# Patient Record
Sex: Male | Born: 1985 | Race: Black or African American | Hispanic: No | Marital: Single | Smoking: Current every day smoker
Health system: Southern US, Community
[De-identification: ages and names within clinical notes are randomized; demographics above are authoritative.]

## PROBLEM LIST (undated history)

## (undated) HISTORY — PX: WRIST ARTHROCENTESIS: SUR48

---

## 2006-09-05 ENCOUNTER — Observation Stay (HOSPITAL_COMMUNITY): Admission: EM | Admit: 2006-09-05 | Discharge: 2006-09-06 | Payer: Self-pay | Admitting: Emergency Medicine
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
## 2010-12-13 LAB — CROSSMATCH: ABO/RH(D): O POS

## 2010-12-13 LAB — CBC
MCV: 92.8
Platelets: 175
RBC: 2.49 — ABNORMAL LOW
WBC: 13.1 — ABNORMAL HIGH

## 2010-12-13 LAB — COMPREHENSIVE METABOLIC PANEL
ALT: <8
AST: 18
Albumin: 2.4 — ABNORMAL LOW
CO2: 28
Chloride: 105
Creatinine, Ser: 0.83
GFR calc Af Amer: >60
GFR calc non Af Amer: >60
Potassium: 3.8
Sodium: 137
Total Bilirubin: 0.5

## 2010-12-13 LAB — POCT I-STAT 4, (NA,K, GLUC, HGB,HCT)
Glucose, Bld: 146 — ABNORMAL HIGH
HCT: 30 — ABNORMAL LOW
Hemoglobin: 10.2 — ABNORMAL LOW
Hemoglobin: 8.5 — ABNORMAL LOW
Operator id: 238531
Potassium: 3.7
Potassium: 4.9
Sodium: 137

## 2010-12-13 LAB — ABO/RH: ABO/RH(D): O POS

## 2015-07-23 ENCOUNTER — Encounter (HOSPITAL_COMMUNITY): Payer: Self-pay | Admitting: *Deleted

## 2015-07-23 DIAGNOSIS — Y998 Other external cause status: Secondary | ICD-10-CM | POA: Insufficient documentation

## 2015-07-23 DIAGNOSIS — Y9389 Activity, other specified: Secondary | ICD-10-CM | POA: Insufficient documentation

## 2015-07-23 DIAGNOSIS — Y92410 Unspecified street and highway as the place of occurrence of the external cause: Secondary | ICD-10-CM | POA: Insufficient documentation

## 2015-07-23 DIAGNOSIS — S29012A Strain of muscle and tendon of back wall of thorax, initial encounter: Secondary | ICD-10-CM | POA: Insufficient documentation

## 2015-07-23 DIAGNOSIS — F17200 Nicotine dependence, unspecified, uncomplicated: Secondary | ICD-10-CM | POA: Insufficient documentation
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
## 2015-07-24 ENCOUNTER — Emergency Department (HOSPITAL_COMMUNITY)
Admission: EM | Admit: 2015-07-24 | Discharge: 2015-07-24 | Disposition: A | Discharge status: Home / Self Care | Payer: Self-pay | Attending: Emergency Medicine | Admitting: Emergency Medicine

## 2015-07-24 DIAGNOSIS — T148XXA Other injury of unspecified body region, initial encounter: Secondary | ICD-10-CM

## 2015-07-24 MED ORDER — METHOCARBAMOL 500 MG PO TABS: 500.0000 mg | ORAL_TABLET | Freq: Two times a day (BID) | ORAL | Status: DC

## 2015-07-24 MED ORDER — IBUPROFEN 800 MG PO TABS
800.0000 mg | ORAL_TABLET | Freq: Three times a day (TID) | ORAL | Status: DC
Start: 2015-07-24 — End: 2021-03-02

## 2015-07-24 NOTE — Discharge Instructions (Signed)
Take your medications as prescribed. I recommend eating prior to taking her ibuprofen to prevent gastrointestinal side effects. I also recommend applying ice to affected area for the next 24 hours for 10-15 minutes 3-4 times daily, he may then apply heat as needed to affected areas for pain relief. Refrain from doing any heavy lifting, squatting or repetitive movements that exacerbate your symptoms for the next few days. Please follow up with a primary care provider from the Resource Guide provided below in 1 week if your symptoms have not improved. Please return to the Emergency Department if symptoms worsen or new onset of fever, numbness, tingling, groin numbness, abdominal pain, vomiting, urinary retention, loss of bowel or bladder, weakness.

## 2015-07-24 NOTE — ED Notes (Signed)
Pt verbalized understanding of d/c instructions and has no further questions. Pt stable and NAD.  
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
## 2021-03-02 ENCOUNTER — Other Ambulatory Visit: Payer: Self-pay

## 2021-03-02 ENCOUNTER — Encounter (HOSPITAL_COMMUNITY): Payer: Self-pay

## 2021-03-02 ENCOUNTER — Ambulatory Visit (HOSPITAL_COMMUNITY)
Admission: EM | Admit: 2021-03-02 | Discharge: 2021-03-02 | Disposition: A | Discharge status: Home / Self Care | Payer: Commercial Managed Care - PPO | Attending: Emergency Medicine | Admitting: Emergency Medicine

## 2021-03-02 DIAGNOSIS — H60331 Swimmer's ear, right ear: Secondary | ICD-10-CM

## 2021-03-02 MED ORDER — OFLOXACIN 0.3 % OT SOLN: 5.0000 [drp] | Freq: Two times a day (BID) | OTIC | 0 refills | Status: AC

## 2021-03-02 MED ORDER — IBUPROFEN 800 MG PO TABS: 800.0000 mg | ORAL_TABLET | Freq: Three times a day (TID) | ORAL | 0 refills | Status: DC

## 2021-03-02 MED ORDER — MECLIZINE HCL 12.5 MG PO TABS: 12.5000 mg | ORAL_TABLET | Freq: Three times a day (TID) | ORAL | 0 refills | Status: AC | PRN

## 2021-03-02 NOTE — Discharge Instructions (Addendum)
You are being treated for an outer ear infection also known as swimmer's ear  Place 5 drops of ofloxacin into your right ear twice a day for the next 7 days  You may use ibuprofen every 8 hours in addition to over-the-counter Tylenol for comfort  You may use meclizine up to 3 times a day to help with dizziness  Please refrain from placing objects inside of ear, if needing to remove drainage gently pat outside of ear to clear substances  If symptoms continue to persist you may follow-up with ear nose and throat specialist for reevaluation

## 2021-03-02 NOTE — ED Triage Notes (Addendum)
Pt c/o rt ear pain with drainage x4 days. States was blowing his nose a lot over a week ago and felt a pop and loud noise to rt ear.

## 2021-03-02 NOTE — ED Provider Notes (Signed)
[facility]    CSN: [id number] Arrival date & time: 03/02/21  1731      History   Chief Complaint Chief Complaint  Patient presents with   Otalgia    HPI Alexei Doswell is a 36 y.o. male.   Patient presents with right ear pain, discharge and itching for 4 days.  Discharge was initially bloody but is described now as creamy and [place] Bolla.  Intermittently feels off balance predominantly when changing from sitting to standing position.  Changes in hearing present, endorses sounds are more intensified .  Has attempted use of TheraFlu which was not helpful.  Denies fever, chills, URI symptoms.    History reviewed. No pertinent past medical history.  There are no problems to display for this patient.   History reviewed. No pertinent surgical history.     Home Medications    Prior to Admission medications   Not on File    Family History History reviewed. No pertinent family history.  Social History Social History   Tobacco Use   Smoking status: Every Day   Smokeless tobacco: Never  Substance Use Topics   Alcohol use: Yes     Allergies   Patient has no known allergies.   Review of Systems Review of Systems  HENT:  Positive for ear pain. Negative for congestion, dental problem, drooling, ear discharge, facial swelling, hearing loss, mouth sores, nosebleeds, postnasal drip, rhinorrhea, sinus pressure, sinus pain, sneezing, sore throat, tinnitus, trouble swallowing and voice change.   Respiratory: Negative.    Cardiovascular: Negative.   Neurological: Negative.     Physical Exam Triage Vital Signs ED Triage Vitals  Enc Vitals Group     BP 03/02/21 1940 (!) 136/93     Pulse Rate 03/02/21 1940 92     Resp 03/02/21 1940 18     Temp 03/02/21 1940 98.3 F (36.8 C)     Temp Source 03/02/21 1940 Oral     SpO2 03/02/21 1940 97 %     Weight --      Height --      Head Circumference --      Peak Flow --      Pain Score 03/02/21 1941 6     Pain Loc --       Pain Edu? --      Excl. in GC? --    No data found.  Updated Vital Signs BP (!) 136/93 (BP Location: Right Arm)    Pulse 92    Temp 98.3 F (36.8 C) (Oral)    Resp 18    SpO2 97%   Visual Acuity Right Eye Distance:   Left Eye Distance:   Bilateral Distance:    Right Eye Near:   Left Eye Near:    Bilateral Near:     Physical Exam Constitutional:      Appearance: Normal appearance. He is normal weight.  HENT:     Head: Normocephalic.     Right Ear: Drainage and tenderness present.     Left Ear: Hearing, tympanic membrane, ear canal and external ear normal.  Eyes:     Extraocular Movements: Extraocular movements intact.  Neurological:     Mental Status: He is alert and oriented to person, place, and time. Mental status is at baseline.  Psychiatric:        Mood and Affect: Mood normal.        Behavior: Behavior normal.     UC Treatments / Results  Labs (all labs ordered  are listed, but only abnormal results are displayed) Labs Reviewed - No data to display  EKG   Radiology No results found.  Procedures Procedures (including critical care time)  Medications Ordered in UC Medications - No data to display  Initial Impression / Assessment and Plan / UC Course  I have reviewed the triage vital signs and the nursing notes.  Pertinent labs & imaging results that were available during my care of the patient were reviewed by me and considered in my medical decision making (see chart for details).  Acute swimmer's ear of right side  1.  Ofloxacin 0.3% otic solution 5 drops to the right ear twice daily for 7 days 2.  Ibuprofen 800 mg 3 times daily 3.  Meclizine 12.5 mg 3 times daily as needed 4.  Patient does not have PCP given walking referral to ENT for reevaluation for persistent symptoms 5.  Advised patient to discontinue placing objects into ear into attempt to keep ears dry until symptoms have resolved Final Clinical Impressions(s) / UC Diagnoses   Final  diagnoses:  None   Discharge Instructions   None    ED Prescriptions   None    PDMP not reviewed this encounter.   Valinda Hoar, [place] 03/03/21 [phone]

## 2023-09-03 ENCOUNTER — Ambulatory Visit (HOSPITAL_COMMUNITY)
Admission: EM | Admit: 2023-09-03 | Discharge: 2023-09-03 | Disposition: A | Discharge status: Home / Self Care | Attending: Family Medicine | Admitting: Family Medicine

## 2023-09-03 ENCOUNTER — Other Ambulatory Visit: Payer: Self-pay

## 2023-09-03 ENCOUNTER — Encounter (HOSPITAL_COMMUNITY): Payer: Self-pay | Admitting: Emergency Medicine

## 2023-09-03 DIAGNOSIS — K0889 Other specified disorders of teeth and supporting structures: Secondary | ICD-10-CM | POA: Diagnosis not present

## 2023-09-03 MED ORDER — IBUPROFEN 800 MG PO TABS
800.0000 mg | ORAL_TABLET | Freq: Once | ORAL | Status: AC
  Administered 2023-09-03: 800 mg via ORAL

## 2023-09-03 MED ORDER — IBUPROFEN 800 MG PO TABS: 800.0000 mg | ORAL_TABLET | Freq: Three times a day (TID) | ORAL | 0 refills | Status: AC | PRN

## 2023-09-03 MED ORDER — IBUPROFEN 800 MG PO TABS
ORAL_TABLET | ORAL | Status: AC
  Filled 2023-09-03: qty 1

## 2023-09-03 MED ORDER — AMOXICILLIN-POT CLAVULANATE 875-125 MG PO TABS: 1.0000 | ORAL_TABLET | Freq: Two times a day (BID) | ORAL | 0 refills | Status: AC

## 2023-09-03 NOTE — ED Provider Notes (Signed)
 [facility]    CSN: [id number] Arrival date & time: 09/03/23  9163      History   Chief Complaint Chief Complaint  Patient presents with   Dental Pain    HPI Nathan Chavez is a 38 y.o. male.   Dental pain x 1 week A few days ago, was having bleeding from mouth Now reports severe pain, unable to eat, R sided facial swelling and eye tearing Tried using antiseptic mouthwash  Patient reports he might have a hole in his tooth which he thinks is the source of his pain Has not seen a dentist recently Has seen an ENT in the distant past for perforated right TM, does not recall any surgery  Denies fevers, difficulty swallowing, difficulty breathing.  Has been able to keep down fluids but has been eating less Does endorse 1 episode of vomiting last week when symptoms started To smoke cigarettes but has been smoking less recently  Has not been taking any medications including tylenol, ibuprofen  Denies taking any meds at home    Dental Pain   History reviewed. No pertinent past medical history.  There are no active problems to display for this patient.   History reviewed. No pertinent surgical history.     Home Medications    Prior to Admission medications   Medication Sig Start Date End Date Taking? Authorizing Provider  amoxicillin -clavulanate (AUGMENTIN ) 875-125 MG tablet Take 1 tablet by mouth 2 (two) times daily for 7 days. 09/03/23 09/10/23 Yes Romelle Booty, MD  ibuprofen  (ADVIL ) 800 MG tablet Take 1 tablet (800 mg total) by mouth every 8 (eight) hours as needed. Take with food - avoid using on an empty stomach 09/03/23   Romelle Booty, MD  meclizine  (ANTIVERT ) 12.5 MG tablet Take 1 tablet (12.5 mg total) by mouth 3 (three) times daily as needed for dizziness. 03/02/21   White, Shelba SAUNDERS, NP  ofloxacin  (FLOXIN ) 0.3 % OTIC solution Place 5 drops into the right ear 2 (two) times daily. 03/02/21   Teresa Shelba SAUNDERS, NP    Family History History reviewed. No  pertinent family history.  Social History Social History   Tobacco Use   Smoking status: Every Day   Smokeless tobacco: Never  Vaping Use   Vaping status: Never Used  Substance Use Topics   Alcohol use: Yes   Drug use: Yes    Types: Marijuana     Allergies   Patient has no known allergies.   Review of Systems Review of Systems Per HPI   Physical Exam Triage Vital Signs ED Triage Vitals [09/03/23 0916]  Encounter Vitals Group     BP      Girls Systolic BP Percentile      Girls Diastolic BP Percentile      Boys Systolic BP Percentile      Boys Diastolic BP Percentile      Pulse      Resp      Temp      Temp src      SpO2      Weight      Height      Head Circumference      Peak Flow      Pain Score 8     Pain Loc      Pain Education      Exclude from Growth Chart    No data found.  Updated Vital Signs BP (!) 164/100 (BP Location: Left Arm)   Pulse 86   Temp  98.2 F (36.8 C) (Oral)   Resp 18   SpO2 98%   Visual Acuity Right Eye Distance:   Left Eye Distance:   Bilateral Distance:    Right Eye Near:   Left Eye Near:    Bilateral Near:     Physical Exam Constitutional:      General: He is not in acute distress.    Appearance: Normal appearance.  HENT:     Head: No right periorbital erythema or left periorbital erythema.     Comments: TTP over R upper jaw. Mild swelling noted to R side of face with no obvious abscess or fluctuance.     Right Ear: Ear canal normal.     Left Ear: Tympanic membrane and ear canal normal.     Ears:     Comments: R TM erythematous    Mouth/Throat:     Mouth: Mucous membranes are moist. No oral lesions.     Dentition: No dental abscesses.     Palate: No mass.     Pharynx: Oropharynx is clear. Uvula midline. No oropharyngeal exudate or posterior oropharyngeal erythema.     Tonsils: No tonsillar abscesses.     Comments: No obvious abscess or space occupying lesion/mass/swelling in oropharynx Eyes:     General:  Vision grossly intact.     Extraocular Movements: Extraocular movements intact.     Pupils: Pupils are equal, round, and reactive to light.     Comments: No pain with EOM  Cardiovascular:     Rate and Rhythm: Normal rate and regular rhythm.     Heart sounds: Normal heart sounds.  Pulmonary:     Effort: Pulmonary effort is normal. No respiratory distress.     Breath sounds: Normal breath sounds. No stridor.  Abdominal:     General: Abdomen is flat.     Palpations: Abdomen is soft.  Skin:    Findings: No erythema or rash.  Neurological:     General: No focal deficit present.     Mental Status: He is alert.      UC Treatments / Results  Labs (all labs ordered are listed, but only abnormal results are displayed) Labs Reviewed - No data to display  EKG   Radiology No results found.  Procedures Procedures (including critical care time)  Medications Ordered in UC Medications  ibuprofen  (ADVIL ) tablet 800 mg (has no administration in time range)    Initial Impression / Assessment and Plan / UC Course  I have reviewed the triage vital signs and the nursing notes.  Pertinent labs & imaging results that were available during my care of the patient were reviewed by me and considered in my medical decision making (see chart for details).      Right-sided dental pain Low suspicion for overt infection or abscess given he is afebrile without any obvious abscess on exam.  Able to swallow his saliva and uvula is midline  However given concern for periodontal etiology and tenderness on exam will cover with Augmentin  for 7 days Ibuprofen  800 mg x 1 here, Rx sent to home pharmacy for use as needed  Discussed return precautions and supportive care at home with OTC Tylenol in addition to the prescription ibuprofen  Provided resources for him to find a dentist  Final Clinical Impressions(s) / UC Diagnoses   Final diagnoses:  Pain, dental     Discharge Instructions       Please take Augmentin  (antibiotic) 2 times a day for 7 days  I recommend  you see a dentist soon please see the handout I provided for resources  Please seek medical attention if you develop any difficulty breathing or swallowing, fevers, worsening swelling, vision changes, or severe pain in your eye       ED Prescriptions     Medication Sig Dispense Auth. Provider   amoxicillin -clavulanate (AUGMENTIN ) 875-125 MG tablet Take 1 tablet by mouth 2 (two) times daily for 7 days. 14 tablet Romelle Booty, MD   ibuprofen  (ADVIL ) 800 MG tablet Take 1 tablet (800 mg total) by mouth every 8 (eight) hours as needed. Take with food - avoid using on an empty stomach 21 tablet Romelle Booty, MD      PDMP not reviewed this encounter.   Romelle Booty, MD 09/03/23 [phone]

## 2023-09-03 NOTE — Discharge Instructions (Signed)
 Please take Augmentin  (antibiotic) 2 times a day for 7 days  I recommend you see a dentist soon please see the handout I provided for resources  Please seek medical attention if you develop any difficulty breathing or swallowing, fevers, worsening swelling, vision changes, or severe pain in your eye

## 2023-09-03 NOTE — ED Triage Notes (Signed)
 Reports mouth was bleeding a few days ago.  Patient called out of work over the weekend.  Reports unable to eat.  Complains of right eye tearing and swelling visible to right side of face.    Patient has used antiseptic mouthwash

## 2023-12-27 ENCOUNTER — Other Ambulatory Visit: Payer: Self-pay

## 2023-12-27 ENCOUNTER — Emergency Department (HOSPITAL_COMMUNITY)
Admission: EM | Admit: 2023-12-27 | Discharge: 2023-12-27 | Disposition: A | Discharge status: Home / Self Care | Attending: Emergency Medicine | Admitting: Emergency Medicine

## 2023-12-27 ENCOUNTER — Emergency Department (HOSPITAL_COMMUNITY)

## 2023-12-27 DIAGNOSIS — Y9301 Activity, walking, marching and hiking: Secondary | ICD-10-CM | POA: Insufficient documentation

## 2023-12-27 DIAGNOSIS — W108XXA Fall (on) (from) other stairs and steps, initial encounter: Secondary | ICD-10-CM | POA: Diagnosis not present

## 2023-12-27 DIAGNOSIS — S82022A Displaced longitudinal fracture of left patella, initial encounter for closed fracture: Secondary | ICD-10-CM | POA: Diagnosis not present

## 2023-12-27 DIAGNOSIS — S8992XA Unspecified injury of left lower leg, initial encounter: Secondary | ICD-10-CM | POA: Diagnosis present

## 2023-12-27 MED ORDER — HYDROCODONE-ACETAMINOPHEN 5-325 MG PO TABS
1.0000 | ORAL_TABLET | Freq: Once | ORAL | Status: AC
  Administered 2023-12-27: 1 via ORAL
  Filled 2023-12-27: qty 1

## 2023-12-27 MED ORDER — HYDROCODONE-ACETAMINOPHEN 5-325 MG PO TABS: 1.0000 | ORAL_TABLET | Freq: Four times a day (QID) | ORAL | 0 refills | Status: DC | PRN

## 2023-12-27 NOTE — ED Provider Notes (Signed)
 [place] EM[facility]ENC[facility] DE[facility] AT [facility] Provider Note   CSN: [id number] Arrival date & time: 12/27/23  8267     Patient presents with: Fall and Knee Pain   Nathan Chavez is a 38 y.o. male.   HPI   38 year old male presents emergency department with left knee injury.  He states that he was walking down a few steps when he lost his balance and fell down onto the left knee.  Had immediate pain, swelling and inability to weight-bear.  Prior to Admission medications   Medication Sig Start Date End Date Taking? Authorizing Provider  ibuprofen  (ADVIL ) 800 MG tablet Take 1 tablet (800 mg total) by mouth every 8 (eight) hours as needed. Take with food - avoid using on an empty stomach 09/03/23   Romelle Booty, MD  meclizine  (ANTIVERT ) 12.5 MG tablet Take 1 tablet (12.5 mg total) by mouth 3 (three) times daily as needed for dizziness. 03/02/21   White, Shelba SAUNDERS, NP  ofloxacin  (FLOXIN ) 0.3 % OTIC solution Place 5 drops into the right ear 2 (two) times daily. 03/02/21   Teresa Shelba SAUNDERS, NP    Allergies: Patient has no known allergies.    Review of Systems  Respiratory:  Negative for shortness of breath.   Cardiovascular:  Negative for chest pain.  Gastrointestinal:  Negative for abdominal pain.  Musculoskeletal:  Negative for back pain and neck pain.       + Left knee injury and pain  Skin:  Negative for wound.  Neurological:  Negative for numbness.    Updated Vital Signs BP 127/83   Pulse 78   Temp 98.1 F (36.7 C) (Oral)   Resp 16   SpO2 99%   Physical Exam Vitals and nursing note reviewed.  Constitutional:      Appearance: Normal appearance.  HENT:     Head: Normocephalic.     Mouth/Throat:     Mouth: Mucous membranes are moist.  Cardiovascular:     Rate and Rhythm: Normal rate.  Pulmonary:     Effort: Pulmonary effort is normal. No respiratory distress.  Musculoskeletal:     Comments: Left knee edema and palpable deformity with the patella, inability  to extend the left knee, leg is otherwise neurovascularly intact  Skin:    General: Skin is warm.  Neurological:     Mental Status: He is alert and oriented to person, place, and time. Mental status is at baseline.  Psychiatric:        Mood and Affect: Mood normal.     (all labs ordered are listed, but only abnormal results are displayed) Labs Reviewed - No data to display  EKG: None  Radiology: DG Knee Complete 4 Views Left Result Date: 12/27/2023 CLINICAL DATA:  Fall and trauma to the left knee. EXAM: LEFT KNEE - COMPLETE 4+ VIEW COMPARISON:  None Available. FINDINGS: There is displaced fracture of the midportion of the patella with approximately 15 mm distraction gap. No acute fracture. No dislocation. No arthritic changes. There is soft tissue swelling of the knee. No radiopaque foreign object or soft tissue gas. IMPRESSION: Displaced fracture of the midportion of the patella. Electronically Signed   By: Vanetta Chou M.D.   On: 12/27/2023 18:33     Procedures   Medications Ordered in the ED - No data to display  Medical Decision Making Amount and/or Complexity of Data Reviewed Radiology: ordered.   38 year old male presents emergency department with mechanical fall down onto the left knee.  He has obvious deformity on physical exam.  Vitals are stable.  Leg is neurovascularly intact.  X-ray confirms displaced left patella fracture.  Consulted with on-call orthopedic surgeon, Dr. Kendal.  He recommends leg immobilizer, crutches and outpatient follow-up in the office next week for discussion about surgical repair.  Patient and spouse have been updated.  Currently pain is controlled.  Patient at this time appears safe and stable for discharge and close outpatient follow up. Discharge plan and strict return to ED precautions discussed, patient verbalizes understanding and agreement.     Final diagnoses:  None    ED Discharge Orders      None          Bari Roxie HERO, DO 12/27/23 2134

## 2023-12-27 NOTE — ED Notes (Signed)
Ortho tech called for immobilizer 

## 2023-12-27 NOTE — ED Triage Notes (Signed)
 Pt pov for left knee pain, pt reports he fell down the steps of his porch earlier today. Pt unable to bear weight on leg.

## 2023-12-27 NOTE — Progress Notes (Signed)
 Orthopedic Tech Progress Note Patient Details:  Nathan Chavez [birth year] [id number]  Ortho Devices Type of Ortho Device: Knee Immobilizer, Crutches Ortho Device/Splint Location: L PATELLA Ortho Device/Splint Interventions: Ordered, Application, Adjustment   Post Interventions Patient Tolerated: Well Instructions Provided: Care of device  [name] [name] 12/27/2023, 10:27 PM

## 2023-12-27 NOTE — Discharge Instructions (Addendum)
 You have been seen and discharged from the emergency department.  You have suffered a left patella (kneecap) fracture.  This will require surgical fixation.  Spoke with the orthopedic surgeon.  He recommends knee immobilizer.  You may weight-bear as long as the knee is straightened in the immobilizer.  Use crutches otherwise.  Call the office tomorrow to schedule follow-up appointment next week to discuss surgical options.  You have been prescribed pain medicine.  Take as directed.  Do not mix this medication with alcohol or other sedating medications. Do not drive or do heavy physical activity until you know how this medication affects you.  It may cause drowsiness.  Follow-up with your primary provider for further evaluation and further care. Take home medications as prescribed. If you have any worsening symptoms or further concerns for your health please return to an emergency department for further evaluation.

## 2024-01-08 ENCOUNTER — Other Ambulatory Visit: Payer: Self-pay

## 2024-01-08 ENCOUNTER — Encounter (HOSPITAL_BASED_OUTPATIENT_CLINIC_OR_DEPARTMENT_OTHER): Payer: Self-pay

## 2024-01-08 NOTE — Progress Notes (Signed)
   01/08/24 1146  PAT Phone Screen  Is the patient taking a GLP-1 receptor agonist? No  Do You Have Diabetes? No  Do You Have Hypertension? No  Have You Ever Been to the ER for Asthma? No  Have You Taken Oral Steroids in the Past 3 Months? No  Do you Take Phenteramine or any Other Diet Drugs? No  Recent  Lab Work, EKG, CXR? No  Do you have a history of heart problems? No  Any Recent Hospitalizations? No  Height 5' 10 (1.778 m)  Weight 68 kg  Pat Appointment Scheduled (S)  Yes (ensure, CMP daily ETOH)

## 2024-01-08 NOTE — Progress Notes (Signed)
   01/08/24 1146  PAT Phone Screen  Is the patient taking a GLP-1 receptor agonist? No  Do You Have Diabetes? No  Do You Have Hypertension? No  Have You Ever Been to the ER for Asthma? No  Have You Taken Oral Steroids in the Past 3 Months? No  Do you Take Phenteramine or any Other Diet Drugs? No  Recent  Lab Work, EKG, CXR? No  Do you have a history of heart problems? No  Any Recent Hospitalizations? No  Height 5' 10 (1.778 m)  Weight 68 kg  Pat Appointment Scheduled Yes (ensure)

## 2024-01-09 ENCOUNTER — Encounter (HOSPITAL_BASED_OUTPATIENT_CLINIC_OR_DEPARTMENT_OTHER)
Admission: RE | Admit: 2024-01-09 | Discharge: 2024-01-09 | Disposition: A | Discharge status: Home / Self Care | Source: Ambulatory Visit

## 2024-01-09 DIAGNOSIS — F17200 Nicotine dependence, unspecified, uncomplicated: Secondary | ICD-10-CM | POA: Diagnosis not present

## 2024-01-09 DIAGNOSIS — W19XXXA Unspecified fall, initial encounter: Secondary | ICD-10-CM | POA: Diagnosis not present

## 2024-01-09 DIAGNOSIS — S82042A Displaced comminuted fracture of left patella, initial encounter for closed fracture: Secondary | ICD-10-CM | POA: Diagnosis not present

## 2024-01-09 DIAGNOSIS — S82032A Displaced transverse fracture of left patella, initial encounter for closed fracture: Secondary | ICD-10-CM | POA: Diagnosis present

## 2024-01-09 LAB — COMPREHENSIVE METABOLIC PANEL WITH GFR
ALT: 23 U/L (ref 0–44)
AST: 43 U/L — ABNORMAL HIGH (ref 15–41)
Albumin: 4.2 g/dL (ref 3.5–5.0)
Alkaline Phosphatase: 53 U/L (ref 38–126)
Anion gap: 14 (ref 5–15)
BUN: 5 mg/dL — ABNORMAL LOW (ref 6–20)
CO2: 22 mmol/L (ref 22–32)
Calcium: 9.5 mg/dL (ref 8.9–10.3)
Chloride: 95 mmol/L — ABNORMAL LOW (ref 98–111)
Creatinine, Ser: 0.74 mg/dL (ref 0.61–1.24)
GFR, Estimated: >60 mL/min (ref >60)
Glucose, Bld: 109 mg/dL — ABNORMAL HIGH (ref 70–99)
Potassium: 4.3 mmol/L (ref 3.5–5.1)
Sodium: 131 mmol/L — ABNORMAL LOW (ref 135–145)
Total Bilirubin: 1 mg/dL (ref 0.0–1.2)
Total Protein: 8.2 g/dL — ABNORMAL HIGH (ref 6.5–8.1)

## 2024-01-09 NOTE — H&P (Signed)
 ORTHOPAEDIC H&P  REQUESTING PHYSICIAN: Teresa Rankin ORN, MD  PCP:  Freddrick, No  Chief Complaint: Left patella fracture  HPI: Nathan Chavez is a 38 y.o. male who presents with left patella fracture s/p fall with complete disruption of the extensor mechanism.  The patient presents today for surgical intervention. He has had no interval changes in his medical history.  History reviewed. No pertinent past medical history. Past Surgical History:  Procedure Laterality Date   WRIST ARTHROCENTESIS Left    Social History   Socioeconomic History   Marital status: Single    Spouse name: Not on file   Number of children: Not on file   Years of education: Not on file   Highest education level: Not on file  Occupational History   Not on file  Tobacco Use   Smoking status: Every Day   Smokeless tobacco: Never  Vaping Use   Vaping status: Never Used  Substance and Sexual Activity   Alcohol use: Yes   Drug use: Yes    Types: Marijuana   Sexual activity: Not on file  Other Topics Concern   Not on file  Social History Narrative   Not on file   Social Drivers of Health   Financial Resource Strain: Not on file  Food Insecurity: Not on file  Transportation Needs: Not on file  Physical Activity: Not on file  Stress: Not on file  Social Connections: Not on file   History reviewed. No pertinent family history. No Known Allergies Prior to Admission medications   Medication Sig Start Date End Date Taking? Authorizing Provider  HYDROcodone-acetaminophen (NORCO/VICODIN) 5-325 MG tablet Take 1 tablet by mouth every 6 (six) hours as needed. 12/27/23  Yes Horton, Kristie M, DO  ibuprofen  (ADVIL ) 800 MG tablet Take 1 tablet (800 mg total) by mouth every 8 (eight) hours as needed. Take with food - avoid using on an empty stomach 09/03/23  Yes Romelle Booty, MD  meclizine  (ANTIVERT ) 12.5 MG tablet Take 1 tablet (12.5 mg total) by mouth 3 (three) times daily as needed for dizziness. 03/02/21   Randal Goens,  Shelba SAUNDERS, NP  ofloxacin  (FLOXIN ) 0.3 % OTIC solution Place 5 drops into the right ear 2 (two) times daily. 03/02/21   Teresa Shelba SAUNDERS, NP   No results found.  Positive ROS: All other systems have been reviewed and were otherwise negative with the exception of those mentioned in the HPI and as above.  Physical Exam: General: Alert, no acute distress Cardiovascular: No pedal edema Respiratory: No cyanosis, no use of accessory musculature GI: No organomegaly, abdomen is soft and non-tender Skin: No lesions in the area of chief complaint Neurologic: Sensation intact distally Psychiatric: Patient is competent for consent with normal mood and affect Lymphatic: No axillary or cervical lymphadenopathy  MUSCULOSKELETAL: Left knee - skin intact, swelling of the left knee - TTP left knee - palpable gap of the patella - unable to perform straight leg raise - sensation intact sural, saphenous, tibial, deep and superficial peroneal nerve distributions - 2+ DP pulse  Assessment: Left knee transverse patella fracture  Plan: Informed consent obtained Proceed with ORIF of the left patella fracture Regional nerve block to be performed by anesthesia Pre op ancef 2 g Oxycodone script to be prescribed today TROM to be provided today ASA 81 mg BID  x 6 weeks Return to clinic 2 weeks post op for skin check and repeat x-rays left knee.    Rankin ORN Teresa, MD    01/10/2024  12:45 PM

## 2024-01-09 NOTE — Op Note (Signed)
 DATE OF SURGERY: 01/09/2024  PREOPERATIVE DIAGNOSES:  1. .Left ,  comminuted patellar fracture POSTOPERATIVE DIAGNOSES:  1. The same  PROCEDURES:  1. Left  ORIF of the left patella  SURGEON: Surgeons and Role:    * Brynlynn Walko, Rankin ORN, MD - Primary  ANESTHESIA: General  TOURNIQUET TIME:  2 hours  BLOOD LOSS: Minimal.   COMPLICATIONS: None.   PATHOLOGY: None.   FINDINGS:  comminuted intra-articular patella fracture with tears within the lateral and medial retinaculum of the patella  ASSISTANTS:  none  INDICATIONS: The patient is a 38 y.o. male who presented with  a left comminuted displaced intra-articular patella fracture who  was indicated for surgery due to  to restore articular congruency and extensor mechanism of the left lower extremity.  The patient was seen in the preop holding room, the operative site, consent form and indications were reviewed with the patient.  The extremity was examined and found to have   Palpable gap in the patella along with intact motor and intact sensation to light touch to  left lower extremity as well as 2+  DP pulse. Opportunity was provided the patient for final questions and all questions were answered to their satisfaction. The operative extremity was marked by me.    DESCRIPTION OF PROCEDURE: The patient was brought back to the operating room and transferred to the OR table where they underwent general anesthesia per anesthesia protocol. The patient was placed in the supine position  with all bony prominences well-padded. Prophylactic antibiotics were administered prior to incision.  A well padded tourniquet was inflated.   The extremity was prepped and draped in standard sterile fashion.  A time out was performed identifying the patient, procedure, extremity with laterality, antibiotics given, and all present agreed.     The extremity was exsanguinated with an Esmarch and then the tourniquet was raised to 250 mmHg. A midline incision was made  localized over the patella with a 15 blade followed by electrocautery to dissect down to the fascia overlying the patella.  A large hematoma was evacuated and the patella fracture was exposed.  The fracture was irrigated and was found to be retinacular tears present medially and laterally as well as  significant comminution of the fracture site.  The fracture was reduced using fracture reduction forceps and held in place with K wires.  Biplanar fluoroscopy was obtained demonstrating satisfactory reduction of the fracture and fracture read was also visually inspected.  The articular surface was palpated through the retinacular tears and confirmed to be smooth.  A [employer] star plate was then selected and placement of the plate was confirmed on the patella with biplanar fluoroscopy.  A 4 oh cancellous screw was used to bring the plate to the bone followed by a 2 7 cortical screw applied longitudinally through the plate to provide compression to the articular surface inferiorly to superiorly.  This was followed by an  additional 2.7 cortical screw placed in the superiormost part of the plate followed by 7 locking screws to adequately capture the various fracture fragments.  The cancellous screw was removed prior to final biplanar fluoroscopy and obtained due to poor bite at the end of the procedure.  The locking screws were indicated due to the significant comminution of the fracture.  The knee was taken through range of motion and was found to have able to be flexed to 45 degrees without any significant gapping at the fracture site.  After final hardware fixation was applied,  biplanar fluoroscopy was utilized again to confirm the fracture which was in excellent position along with the hardware.    The knee was copiously irrigated at this time and the retinacular tears were repaired with 0 Vicryl suture and the split within the patellar tendon that was made to allow access of the plate to the bone was repaired  as well with 0 Vicryl suture.  The wound was again copiously irrigated and then vancomycin powder 1 g was placed into the wound.  The skin was closed with a subcutaneous layer with 2-0 Vicryl followed by running 3-0 Monocryl followed by Steri-Strips.  A Mepilex dressing was placed along with Webril TED hose stockings and an Ace wrap and a hinged knee brace was placed locked in extension.   The patient was awakened per anesthesia protocol and transferred to recovery room in stable condition after all counts were correct. There were no complications.  POSTOPERATIVE PLAN:   Patient is to be weightbearing as tolerated with hinged knee brace locked in extension he will require 6 weeks of aspirin 81 mg twice daily for DVT prophylaxis and prescribed oxycodone for pain

## 2024-01-09 NOTE — Progress Notes (Signed)
 Sent pt text reminder to come in for lab work.

## 2024-01-09 NOTE — Progress Notes (Signed)

## 2024-01-10 ENCOUNTER — Other Ambulatory Visit: Payer: Self-pay

## 2024-01-10 ENCOUNTER — Ambulatory Visit (HOSPITAL_COMMUNITY)

## 2024-01-10 ENCOUNTER — Encounter (HOSPITAL_BASED_OUTPATIENT_CLINIC_OR_DEPARTMENT_OTHER): Payer: Self-pay

## 2024-01-10 ENCOUNTER — Ambulatory Visit (HOSPITAL_BASED_OUTPATIENT_CLINIC_OR_DEPARTMENT_OTHER): Admitting: Anesthesiology

## 2024-01-10 ENCOUNTER — Encounter (HOSPITAL_BASED_OUTPATIENT_CLINIC_OR_DEPARTMENT_OTHER): Admission: RE | Disposition: A | Discharge status: Home / Self Care | Payer: Self-pay | Source: Home / Self Care

## 2024-01-10 ENCOUNTER — Ambulatory Visit (HOSPITAL_BASED_OUTPATIENT_CLINIC_OR_DEPARTMENT_OTHER): Admission: RE | Admit: 2024-01-10 | Discharge: 2024-01-10 | Disposition: A | Discharge status: Home / Self Care

## 2024-01-10 DIAGNOSIS — S82042A Displaced comminuted fracture of left patella, initial encounter for closed fracture: Secondary | ICD-10-CM

## 2024-01-10 DIAGNOSIS — F17200 Nicotine dependence, unspecified, uncomplicated: Secondary | ICD-10-CM | POA: Insufficient documentation

## 2024-01-10 DIAGNOSIS — Z8781 Personal history of (healed) traumatic fracture: Secondary | ICD-10-CM

## 2024-01-10 DIAGNOSIS — W19XXXA Unspecified fall, initial encounter: Secondary | ICD-10-CM | POA: Insufficient documentation

## 2024-01-10 DIAGNOSIS — F1090 Alcohol use, unspecified, uncomplicated: Secondary | ICD-10-CM

## 2024-01-10 HISTORY — PX: ORIF PATELLA: SHX5033

## 2024-01-10 SURGERY — OPEN REDUCTION INTERNAL FIXATION (ORIF) PATELLA
Anesthesia: General | Site: Knee | Laterality: Left

## 2024-01-10 MED ORDER — ONDANSETRON HCL 4 MG/2ML IJ SOLN
INTRAMUSCULAR | Status: AC
  Filled 2024-01-10: qty 2

## 2024-01-10 MED ORDER — FENTANYL CITRATE (PF) 100 MCG/2ML IJ SOLN
100.0000 ug | Freq: Once | INTRAMUSCULAR | Status: AC
  Administered 2024-01-10: 100 ug via INTRAVENOUS

## 2024-01-10 MED ORDER — BUPIVACAINE-EPINEPHRINE (PF) 0.5% -1:200000 IJ SOLN
INTRAMUSCULAR | Status: DC | PRN
  Administered 2024-01-10: 30 mL via PERINEURAL

## 2024-01-10 MED ORDER — ONDANSETRON HCL 4 MG/2ML IJ SOLN
INTRAMUSCULAR | Status: DC | PRN
  Administered 2024-01-10: 4 mg via INTRAVENOUS

## 2024-01-10 MED ORDER — ACETAMINOPHEN 500 MG PO TABS
ORAL_TABLET | ORAL | Status: AC
  Filled 2024-01-10: qty 2

## 2024-01-10 MED ORDER — DROPERIDOL 2.5 MG/ML IJ SOLN: 0.6250 mg | Freq: Once | INTRAMUSCULAR | Status: DC | PRN

## 2024-01-10 MED ORDER — MIDAZOLAM HCL (PF) 2 MG/2ML IJ SOLN
2.0000 mg | Freq: Once | INTRAMUSCULAR | Status: AC
  Administered 2024-01-10: 2 mg via INTRAVENOUS

## 2024-01-10 MED ORDER — PROPOFOL 10 MG/ML IV BOLUS
INTRAVENOUS | Status: AC
  Filled 2024-01-10: qty 20

## 2024-01-10 MED ORDER — PHENYLEPHRINE 80 MCG/ML (10ML) SYRINGE FOR IV PUSH (FOR BLOOD PRESSURE SUPPORT)
PREFILLED_SYRINGE | INTRAVENOUS | Status: AC
  Filled 2024-01-10: qty 10

## 2024-01-10 MED ORDER — PHENYLEPHRINE HCL (PRESSORS) 10 MG/ML IV SOLN
INTRAVENOUS | Status: DC | PRN
  Administered 2024-01-10: 80 ug via INTRAVENOUS

## 2024-01-10 MED ORDER — LIDOCAINE 2% (20 MG/ML) 5 ML SYRINGE
INTRAMUSCULAR | Status: AC
  Filled 2024-01-10: qty 5

## 2024-01-10 MED ORDER — ACETAMINOPHEN 500 MG PO TABS: 1000.0000 mg | ORAL_TABLET | Freq: Three times a day (TID) | ORAL | 2 refills | Status: AC | PRN

## 2024-01-10 MED ORDER — ASPIRIN 81 MG PO TBEC: 81.0000 mg | DELAYED_RELEASE_TABLET | Freq: Two times a day (BID) | ORAL | 0 refills | Status: AC

## 2024-01-10 MED ORDER — EPHEDRINE SULFATE (PRESSORS) 25 MG/5ML IV SOSY
PREFILLED_SYRINGE | INTRAVENOUS | Status: DC | PRN
  Administered 2024-01-10 (×3): 5 mg via INTRAVENOUS

## 2024-01-10 MED ORDER — LIDOCAINE HCL (CARDIAC) PF 100 MG/5ML IV SOSY
PREFILLED_SYRINGE | INTRAVENOUS | Status: DC | PRN
  Administered 2024-01-10: 100 mg via INTRAVENOUS

## 2024-01-10 MED ORDER — CLONIDINE HCL (ANALGESIA) 100 MCG/ML EP SOLN
EPIDURAL | Status: DC | PRN
  Administered 2024-01-10: 100 ug

## 2024-01-10 MED ORDER — CEFAZOLIN SODIUM-DEXTROSE 2-4 GM/100ML-% IV SOLN
INTRAVENOUS | Status: AC
  Filled 2024-01-10: qty 100

## 2024-01-10 MED ORDER — OXYCODONE HCL 5 MG PO TABS
5.0000 mg | ORAL_TABLET | Freq: Four times a day (QID) | ORAL | 0 refills | Status: AC | PRN
Start: 2024-01-10 — End: ?

## 2024-01-10 MED ORDER — EPHEDRINE 5 MG/ML INJ
INTRAVENOUS | Status: AC
  Filled 2024-01-10: qty 5

## 2024-01-10 MED ORDER — 0.9 % SODIUM CHLORIDE (POUR BTL) OPTIME
TOPICAL | Status: DC | PRN
  Administered 2024-01-10: 1000 mL

## 2024-01-10 MED ORDER — TRANEXAMIC ACID-NACL 1000-0.7 MG/100ML-% IV SOLN
INTRAVENOUS | Status: AC
  Filled 2024-01-10: qty 100

## 2024-01-10 MED ORDER — HYDROMORPHONE HCL 1 MG/ML IJ SOLN: 0.2500 mg | INTRAMUSCULAR | Status: DC | PRN

## 2024-01-10 MED ORDER — DEXAMETHASONE SOD PHOSPHATE PF 10 MG/ML IJ SOLN
INTRAMUSCULAR | Status: DC | PRN
  Administered 2024-01-10: 10 mg via INTRAVENOUS

## 2024-01-10 MED ORDER — MIDAZOLAM HCL 2 MG/2ML IJ SOLN
INTRAMUSCULAR | Status: AC
  Filled 2024-01-10: qty 2

## 2024-01-10 MED ORDER — LACTATED RINGERS IV SOLN: INTRAVENOUS | Status: DC

## 2024-01-10 MED ORDER — PROPOFOL 10 MG/ML IV BOLUS
INTRAVENOUS | Status: DC | PRN
  Administered 2024-01-10: 200 mg via INTRAVENOUS

## 2024-01-10 MED ORDER — FENTANYL CITRATE (PF) 100 MCG/2ML IJ SOLN
INTRAMUSCULAR | Status: AC
  Filled 2024-01-10: qty 2

## 2024-01-10 MED ORDER — ACETAMINOPHEN 500 MG PO TABS
1000.0000 mg | ORAL_TABLET | Freq: Once | ORAL | Status: AC
  Administered 2024-01-10: 1000 mg via ORAL

## 2024-01-10 MED ORDER — SENNOSIDES-DOCUSATE SODIUM 8.6-50 MG PO TABS: 1.0000 | ORAL_TABLET | Freq: Every day | ORAL | 0 refills | Status: AC

## 2024-01-10 MED ORDER — CEFAZOLIN SODIUM-DEXTROSE 2-4 GM/100ML-% IV SOLN
2.0000 g | INTRAVENOUS | Status: AC
  Administered 2024-01-10: 2 g via INTRAVENOUS

## 2024-01-10 MED ORDER — FENTANYL CITRATE (PF) 100 MCG/2ML IJ SOLN
INTRAMUSCULAR | Status: DC | PRN
  Administered 2024-01-10: 100 ug via INTRAVENOUS
  Administered 2024-01-10 (×4): 25 ug via INTRAVENOUS

## 2024-01-10 MED ORDER — TRANEXAMIC ACID-NACL 1000-0.7 MG/100ML-% IV SOLN
1000.0000 mg | INTRAVENOUS | Status: AC
  Administered 2024-01-10: 1000 mg via INTRAVENOUS

## 2024-01-10 MED ORDER — VANCOMYCIN HCL 1000 MG IV SOLR
INTRAVENOUS | Status: DC | PRN
  Administered 2024-01-10: 1000 mg via TOPICAL

## 2024-01-10 SURGICAL SUPPLY — 66 items
BIT DRILL SRG SHRT 2XAO CNCT (BIT) IMPLANT
BLADE SURG 15 STRL LF DISP TIS (BLADE) ×2 IMPLANT
BNDG COMPR ESMARK 6X3 LF (GAUZE/BANDAGES/DRESSINGS) ×1 IMPLANT
BNDG ELASTIC 4INX 5YD STR LF (GAUZE/BANDAGES/DRESSINGS) ×1 IMPLANT
BNDG ELASTIC 6INX 5YD STR LF (GAUZE/BANDAGES/DRESSINGS) ×1 IMPLANT
BNDG GAUZE DERMACEA FLUFF 4 (GAUZE/BANDAGES/DRESSINGS) IMPLANT
CANISTER SUCT 1200ML W/VALVE (MISCELLANEOUS) ×1 IMPLANT
CLSR STERI-STRIP ANTIMIC 1/2X4 (GAUZE/BANDAGES/DRESSINGS) IMPLANT
COOLER ICEMAN CLASSIC (MISCELLANEOUS) IMPLANT
CUFF TRNQT CYL 34X4.125X (TOURNIQUET CUFF) IMPLANT
DRAPE C-ARM 42X72 X-RAY (DRAPES) ×1 IMPLANT
DRAPE C-ARMOR (DRAPES) ×1 IMPLANT
DRAPE EXTREMITY T 121X128X90 (DISPOSABLE) ×1 IMPLANT
DRAPE INCISE IOBAN 66X45 STRL (DRAPES) IMPLANT
DRAPE U-SHAPE 47X51 STRL (DRAPES) ×1 IMPLANT
DRSG MEPILEX POST OP 4X8 (GAUZE/BANDAGES/DRESSINGS) IMPLANT
ELECT NDL BLADE 2-5/6 (NEEDLE) ×1 IMPLANT
ELECT NEEDLE BLADE 2-5/6 (NEEDLE) ×1 IMPLANT
ELECTRODE REM PT RTRN 9FT ADLT (ELECTROSURGICAL) ×1 IMPLANT
GAUZE SPONGE 4X4 12PLY STRL (GAUZE/BANDAGES/DRESSINGS) ×1 IMPLANT
GLOVE BIO SURGEON STRL SZ8 (GLOVE) ×2 IMPLANT
GLOVE BIOGEL PI IND STRL 8 (GLOVE) ×1 IMPLANT
GOWN STRL REUS W/ TWL LRG LVL3 (GOWN DISPOSABLE) ×1 IMPLANT
GOWN STRL REUS W/ TWL XL LVL3 (GOWN DISPOSABLE) ×1 IMPLANT
IMMOBILIZER KNEE 22 UNIV (SOFTGOODS) IMPLANT
IMMOBILIZER KNEE 24 THIGH 36 (SOFTGOODS) IMPLANT
KWIRE 16 (WIRE) IMPLANT
NDL KEITH (NEEDLE) IMPLANT
NEEDLE KEITH (NEEDLE) IMPLANT
PACK ARTHROSCOPY DSU (CUSTOM PROCEDURE TRAY) ×1 IMPLANT
PACK BASIN DAY SURGERY FS (CUSTOM PROCEDURE TRAY) ×1 IMPLANT
PAD CAST 4YDX4 CTTN HI CHSV (CAST SUPPLIES) IMPLANT
PAD COLD SHLDR WRAP-ON (PAD) IMPLANT
PASSER SUT SWANSON 36MM LOOP (INSTRUMENTS) IMPLANT
PENCIL SMOKE EVACUATOR (MISCELLANEOUS) ×1 IMPLANT
PLATE PATELLA EVOS 2.7 SM (Plate) IMPLANT
SCREW BONE 4.0X16MM OST (Screw) IMPLANT
SCREW CANN 4.0X14 (Screw) IMPLANT
SCREW CORT 2.7X14 T8 EVOS (Screw) IMPLANT
SCREW CORT 2.7X15 T8 ST EVOS (Screw) IMPLANT
SCREW CORT 2.7X16 ST EVOS (Screw) IMPLANT
SCREW CORT 2.7X16 STAR T8 EVOS (Screw) IMPLANT
SCREW CORT VA EVOS 2.7X44 (Screw) IMPLANT
SCREW EVOS 2.7X18 LOCK T8 (Screw) IMPLANT
SHEET MEDIUM DRAPE 40X70 STRL (DRAPES) ×1 IMPLANT
SLEEVE SCD COMPRESS KNEE MED (STOCKING) ×1 IMPLANT
SOLN 0.9% NACL POUR BTL 1000ML (IV SOLUTION) ×1 IMPLANT
SPIKE FLUID TRANSFER (MISCELLANEOUS) IMPLANT
SPONGE T-LAP 18X18 ~~LOC~~+RFID (SPONGE) ×1 IMPLANT
STAPLER SKIN PROX WIDE 3.9 (STAPLE) IMPLANT
STRIP CLOSURE SKIN 1/2X4 (GAUZE/BANDAGES/DRESSINGS) IMPLANT
SUCTION TUBE FRAZIER 10FR DISP (SUCTIONS) ×1 IMPLANT
SUT ETHILON 3 0 PS 1 (SUTURE) IMPLANT
SUT ETHILON 4 0 PS 2 18 (SUTURE) IMPLANT
SUT MNCRL AB 3-0 PS2 18 (SUTURE) ×1 IMPLANT
SUT VIC AB 0 CT1 27XBRD ANBCTR (SUTURE) ×1 IMPLANT
SUT VIC AB 2-0 CT1 TAPERPNT 27 (SUTURE) ×1 IMPLANT
SUT VIC AB 2-0 SH 18 (SUTURE) IMPLANT
SUT VIC AB 2-0 SH 27XBRD (SUTURE) IMPLANT
SUT VIC AB 4-0 PS2 18 (SUTURE) IMPLANT
SUTURE FIBERWR #2 38 T-5 BLUE (SUTURE) IMPLANT
SUTURE FIBERWR #5 38 CONV NDL (SUTURE) IMPLANT
SYR BULB EAR ULCER 3OZ GRN STR (SYRINGE) ×1 IMPLANT
UNDERPAD 30X36 HEAVY ABSORB (UNDERPADS AND DIAPERS) ×1 IMPLANT
WIRE FX150X1.25XDRL TIP (WIRE) IMPLANT
YANKAUER SUCT BULB TIP NO VENT (SUCTIONS) ×1 IMPLANT

## 2024-01-10 NOTE — Anesthesia Preprocedure Evaluation (Addendum)
 Anesthesia Evaluation  Patient identified by MRN, date of birth, ID band Patient awake    Reviewed: Allergy & Precautions, NPO status , Patient's Chart, lab work & pertinent test results  History of Anesthesia Complications Negative for: history of anesthetic complications  Airway Mallampati: II  TM Distance: >3 FB Neck ROM: Full    Dental no notable dental hx.    Pulmonary Current Smoker and Patient abstained from smoking.   Pulmonary exam normal        Cardiovascular negative cardio ROS Normal cardiovascular exam     Neuro/Psych negative neurological ROS     GI/Hepatic negative GI ROS,,,(+)     substance abuse  marijuana use  Endo/Other  negative endocrine ROS    Renal/GU negative Renal ROS     Musculoskeletal Left transverse patella fracture   Abdominal   Peds  Hematology negative hematology ROS (+)   Anesthesia Other Findings   Reproductive/Obstetrics                              Anesthesia Physical Anesthesia Plan  ASA: 2  Anesthesia Plan: General   Post-op Pain Management: Tylenol PO (pre-op)* and Regional block*   Induction: Intravenous  PONV Risk Score and Plan: 2 and Treatment may vary due to age or medical condition, Ondansetron, Dexamethasone and Midazolam  Airway Management Planned: LMA  Additional Equipment: None  Intra-op Plan:   Post-operative Plan: Extubation in OR  Informed Consent: I have reviewed the patients History and Physical, chart, labs and discussed the procedure including the risks, benefits and alternatives for the proposed anesthesia with the patient or authorized representative who has indicated his/her understanding and acceptance.     Dental advisory given  Plan Discussed with: CRNA  Anesthesia Plan Comments:          Anesthesia Quick Evaluation

## 2024-01-10 NOTE — Progress Notes (Signed)
 Orthopedic Tech Progress Note Patient Details:  Nathan Chavez [birth year] [id number] ROM knee brace delivered to Day Surgery Patient ID: Nathan Chavez, male   DOB: 04-May-1985, 38 y.o.   MRN: [id number]  Nathan Chavez 01/10/2024, 12:25 PM

## 2024-01-10 NOTE — Discharge Instructions (Addendum)
 No tylenol until 5:30 p.m.   Orthopedic surgery discharge instructions:  Bandages: -Maintain postoperative bandage until follow-up appointment.  - Do not use any lotions or creams on or around the incision until instructed by your surgeon.  Showering - Do not get bandages wet. Keep clean and dry - Do not submerge the surgical site underwater.    Activity Restrictions - You are allowed to weight bear on the operative extremity with the brace locked in extension.  - Elevate the extremity above the level of the heart and continue to use the ice machine to help with swelling  Medications - For mild to moderate pain use Tylenol as needed around-the-clock, 1000 mg every 8 h for pain. Do not take more than this higher levels can affect kidney function - For breakthrough pain use oxycodone as necessary. - Please take a stool softener such as docusate and senna while taking a narcotic pain medication - Aspirin 81 mg twice daily x 6 weeks to avoid blood clots  Follow Up:  -You will return to see Dr. Teresa in the office in 2 weeks for routine postoperative check with x-rays. PT will begin after this visit.    Post Anesthesia Home Care Instructions  Activity: Get plenty of rest for the remainder of the day. A responsible individual must stay with you for 24 hours following the procedure.  For the next 24 hours, DO NOT: -Drive a car -Advertising copywriter -Drink alcoholic beverages -Take any medication unless instructed by your physician -Make any legal decisions or sign important papers.  Meals: Start with liquid foods such as gelatin or soup. Progress to regular foods as tolerated. Avoid greasy, spicy, heavy foods. If nausea and/or vomiting occur, drink only clear liquids until the nausea and/or vomiting subsides. Call your physician if vomiting continues.  Special Instructions/Symptoms: Your throat may feel dry or sore from the anesthesia or the breathing tube placed in your throat  during surgery. If this causes discomfort, gargle with warm salt water. The discomfort should disappear within 24 hours.  If you had a scopolamine patch placed behind your ear for the management of post- operative nausea and/or vomiting:  1. The medication in the patch is effective for 72 hours, after which it should be removed.  Wrap patch in a tissue and discard in the trash. Wash hands thoroughly with soap and water. 2. You may remove the patch earlier than 72 hours if you experience unpleasant side effects which may include dry mouth, dizziness or visual disturbances. 3. Avoid touching the patch. Wash your hands with soap and water after contact with the patch.   Regional Anesthesia Blocks  1. You may not be able to move or feel the blocked extremity after a regional anesthetic block. This may last may last from 3-48 hours after placement, but it will go away. The length of time depends on the medication injected and your individual response to the medication. As the nerves start to wake up, you may experience tingling as the movement and feeling returns to your extremity. If the numbness and inability to move your extremity has not gone away after 48 hours, please call your surgeon.   2. The extremity that is blocked will need to be protected until the numbness is gone and the strength has returned. Because you cannot feel it, you will need to take extra care to avoid injury. Because it may be weak, you may have difficulty moving it or using it. You may not know what position  it is in without looking at it while the block is in effect.  3. For blocks in the legs and feet, returning to weight bearing and walking needs to be done carefully. You will need to wait until the numbness is entirely gone and the strength has returned. You should be able to move your leg and foot normally before you try and bear weight or walk. You will need someone to be with you when you first try to ensure you do not  fall and possibly risk injury.  4. Bruising and tenderness at the needle site are common side effects and will resolve in a few days.  5. Persistent numbness or new problems with movement should be communicated to the surgeon or the [facility] [phone] [facility] [phone]).

## 2024-01-10 NOTE — Anesthesia Procedure Notes (Signed)
 Anesthesia Regional Block: Femoral nerve block   Pre-Anesthetic Checklist: , timeout performed,  Correct Patient, Correct Site, Correct Laterality,  Correct Procedure, Correct Position, site marked,  Risks and benefits discussed,  Pre-op evaluation,  At surgeon's request and post-op pain management  Laterality: Left  Prep: Maximum Sterile Barrier Precautions used, chloraprep       Needles:  Injection technique: Single-shot  Needle Type: Echogenic Stimulator Needle     Needle Length: 9cm  Needle Gauge: 22     Additional Needles:   Procedures:,,,, ultrasound used (permanent image in chart),,    Narrative:  Start time: 01/10/2024 12:11 PM End time: 01/10/2024 12:14 PM Injection made incrementally with aspirations every 5 mL.  Performed by: Personally  Anesthesiologist: Paul Lamarr BRAVO, MD  Additional Notes: Risks, benefits, and alternative discussed. Patient gave consent for procedure. Patient prepped and draped in sterile fashion. Sedation administered, patient remains easily responsive to voice. Relevant anatomy identified with ultrasound guidance. Local anesthetic given in 5cc increments with no signs or symptoms of intravascular injection. No pain or paraesthesias with injection. Patient monitored throughout procedure with no signs of LAST or immediate complications. Tolerated well. Ultrasound image placed in chart.  LANEY Paul, MD

## 2024-01-10 NOTE — Progress Notes (Signed)
 Assisted Dr. Paul with left, femoral, ultrasound guided block. Side rails up, monitors on throughout procedure. See vital signs in flow sheet. Tolerated Procedure well.

## 2024-01-10 NOTE — Transfer of Care (Signed)
 Immediate Anesthesia Transfer of Care Note  Patient: Nathan Chavez  Procedure(s) Performed: OPEN REDUCTION INTERNAL FIXATION (ORIF) PATELLA (Left: Knee)  Patient Location: PACU  Anesthesia Type:GA combined with regional for post-op pain  Level of Consciousness: awake, alert , and oriented  Airway & Oxygen Therapy: Patient Spontanous Breathing and Patient connected to face mask oxygen  Post-op Assessment: Report given to RN and Post -op Vital signs reviewed and stable  Post vital signs: Reviewed and stable  Last Vitals:  Vitals Value Taken Time  BP 138/92 01/10/24 16:21  Temp    Pulse 84 01/10/24 16:23  Resp 11 01/10/24 16:23  SpO2 100 % 01/10/24 16:23  Vitals shown include unfiled device data.  Last Pain:  Vitals:   01/10/24 1120  PainSc: 8       Patients Stated Pain Goal: 6 (01/10/24 1120)  Complications: No notable events documented.

## 2024-01-10 NOTE — Anesthesia Postprocedure Evaluation (Signed)
 Anesthesia Post Note  Patient: Nathan Chavez  Procedure(s) Performed: OPEN REDUCTION INTERNAL FIXATION (ORIF) PATELLA (Left: Knee)     Patient location during evaluation: PACU Anesthesia Type: General Level of consciousness: awake and alert Pain management: pain level controlled Vital Signs Assessment: post-procedure vital signs reviewed and stable Respiratory status: spontaneous breathing, nonlabored ventilation and respiratory function stable Cardiovascular status: blood pressure returned to baseline Postop Assessment: no apparent nausea or vomiting Anesthetic complications: no   No notable events documented.  Last Vitals:  Vitals:   01/10/24 1222 01/10/24 1622  BP: (!) 149/98 (!) 138/92  Pulse: 91 82  Resp: (!) 24   Temp:  36.7 C  SpO2: 100% 99%    Last Pain:  Vitals:   01/10/24 1622  PainSc: 0-No pain                 Vertell Row

## 2024-01-10 NOTE — Anesthesia Procedure Notes (Signed)
 Procedure Name: LMA Insertion Date/Time: 01/10/2024 1:29 PM  Performed by: Debarah Chiquita LABOR, CRNAPre-anesthesia Checklist: Patient identified, Emergency Drugs available, Suction available and Patient being monitored Patient Re-evaluated:Patient Re-evaluated prior to induction Oxygen Delivery Method: Circle system utilized Preoxygenation: Pre-oxygenation with 100% oxygen Induction Type: IV induction Ventilation: Mask ventilation without difficulty LMA: LMA inserted LMA Size: 4.0 Number of attempts: 1 Airway Equipment and Method: Bite block Placement Confirmation: positive ETCO2 Tube secured with: Tape Dental Injury: Teeth and Oropharynx as per pre-operative assessment

## 2024-01-11 ENCOUNTER — Encounter (HOSPITAL_BASED_OUTPATIENT_CLINIC_OR_DEPARTMENT_OTHER): Payer: Self-pay
